# Patient Record
Sex: Female | Born: 1970 | Hispanic: No | Marital: Married | Smoking: Never smoker
Health system: Southern US, Community
[De-identification: ages and names within clinical notes are randomized; demographics above are authoritative.]

## PROBLEM LIST (undated history)

## (undated) DIAGNOSIS — M25531 Pain in right wrist: Secondary | ICD-10-CM

## (undated) DIAGNOSIS — M549 Dorsalgia, unspecified: Secondary | ICD-10-CM

## (undated) DIAGNOSIS — G8929 Other chronic pain: Secondary | ICD-10-CM

## (undated) HISTORY — DX: Pain in right wrist: M25.531

## (undated) HISTORY — DX: Other chronic pain: G89.29

## (undated) HISTORY — DX: Dorsalgia, unspecified: M54.9

---

## 2001-11-20 ENCOUNTER — Emergency Department (HOSPITAL_COMMUNITY): Admission: EM | Admit: 2001-11-20 | Discharge: 2001-11-20 | Payer: Self-pay | Admitting: *Deleted

## 2001-11-20 ENCOUNTER — Encounter: Payer: Self-pay | Admitting: Emergency Medicine

## 2001-11-30 ENCOUNTER — Observation Stay (HOSPITAL_COMMUNITY): Admission: RE | Admit: 2001-11-30 | Discharge: 2001-11-30 | Payer: Self-pay | Admitting: General Surgery

## 2001-11-30 ENCOUNTER — Encounter: Payer: Self-pay | Admitting: General Surgery

## 2001-11-30 ENCOUNTER — Encounter (INDEPENDENT_AMBULATORY_CARE_PROVIDER_SITE_OTHER): Payer: Self-pay

## 2007-05-26 ENCOUNTER — Other Ambulatory Visit: Admission: RE | Admit: 2007-05-26 | Discharge: 2007-05-26 | Payer: Self-pay | Admitting: Gynecology

## 2008-05-30 ENCOUNTER — Other Ambulatory Visit: Admission: RE | Admit: 2008-05-30 | Discharge: 2008-05-30 | Payer: Self-pay | Admitting: Gynecology

## 2011-03-22 NOTE — Op Note (Signed)
[facility]  Patient:    Crystal Guerrero, Crystal Guerrero Visit Number: [id number] MRN: [id number]          Service Type: SUR Location: [id number] Attending Physician:  Henrene Dodge Dictated by:   Anselm Pancoast. Zachery Dakins, M.D. Proc. Date: 11/30/01 Admit Date:  11/30/2001                             Operative Report  PREOPERATIVE DIAGNOSIS:  Chronic cholecystitis with stones.  POSTOPERATIVE DIAGNOSIS:  Chronic cholecystitis with stones.  OPERATION:  Laparoscopic cholecystectomy with cholangiogram.  ANESTHESIA:  General.  SURGEON:  Anselm Pancoast. Zachery Dakins, M.D.  ASSISTANT:  Sharlet Salina T. Hoxworth, M.D.  HISTORY:  Crystal Guerrero is a [place] American who presented to the [facility] Emergency Room with severe epigastric pain. She has had previous little similar episodes and she was evaluated with pain medication and then ultrasound of the gallbladder that showed stones. I saw her in the office this past week and she was not acutely ill at that time, but she desired to proceed on with a laparoscopic cholecystectomy promptly. She has had two pregnancies, the last one was about six months ago. The patient was otherwise in good health. Preoperatively, she was given 3 g of Unasyn as well as PAS stockings.  DESCRIPTION OF PROCEDURE:  Induction of general anesthesia with endotracheal tube, oral tube into the stomach, and then the abdomen was prepped with Betadine surgical solution and draped in a sterile manner. A small incision was made below the umbilicus, the fascia identified and picked up between two Kochers and small opening made and I carefully opened into the peritoneal cavity. A pursestring suture of 0 Vicryl was placed and then the Hasson cannula introduced. The gallbladder had adhesions around it; it was not acutely inflamed. The upper 10 mm trocar was placed under direct vision in the subxiphoid area after anesthetizing the fascia, and two lateral 5 mm  trocars were placed in the appropriate position. I then sort of grasped the gallbladder, retracted it up and outward. The inferior grasper was used to grab the adhesions around the gallbladder and then I kind of carefully took these down with hot scissors. The cystic duct was visualized and there was a stone sort of impacted in the neck of the cystic duct and gallbladder, and we then pushed this back up into the gallbladder and then placed a clip flush with the gallbladder junction with the cystic duct. The cystic artery area was freed up and this was doubly clipped proximally, singly distally, and then a small opening was made into the cystic duct. The [facility] catheter was introduced, held in place with a clip, and x-ray was obtained and showed good prompt filling of the extrahepatic biliary system; kind of a long cystic duct and I then withdrew the catheter, triply clipped the cystic duct kind of going a little more proximal and then divided it.  Next, the cystic artery, I actually placed another clip flush with the gallbladder and then left all three clips on the cystic duct and the cystic artery then you could visualize nicely. The gallbladder from then freed from its bed using the hook electrocautery. I cut the _______ vessels that I think had been previously clipped or reclipped because of their size on the edge of the gallbladder wall. The gallbladder was placed in the Endocatch bag and then withdrawn at the umbilicus. There was good hemostasis of the bed and then  the irrigating fluid that had been used was removed and we reaspirated and inspected good hemostasis, and removed the aspirated fluid. I then placed an extra figure-of-eight suture in the umbilicus fascia and anesthetized this with Marcaine, all total about 20 cc of the Marcaine was used. The lateral 5 mm trocars were withdrawn. The subcutaneous incisions with closed with 4-0 Vicryl and then benzoin and Steri-Strips on the  skin. The patient tolerated the procedure nicely and was extubated and sent to recovery room in satisfactory postoperative condition. Dictated by:   Anselm Pancoast. Zachery Dakins, M.D. Attending Physician:  Henrene Dodge DD:  11/30/01 TD:  11/30/01 Job: [id number]

## 2016-03-12 ENCOUNTER — Encounter (HOSPITAL_COMMUNITY): Payer: Self-pay

## 2016-03-12 ENCOUNTER — Emergency Department (HOSPITAL_COMMUNITY)
Admission: EM | Admit: 2016-03-12 | Discharge: 2016-03-12 | Disposition: A | Discharge status: Home / Self Care | Payer: No Typology Code available for payment source | Attending: Emergency Medicine | Admitting: Emergency Medicine

## 2016-03-12 ENCOUNTER — Emergency Department (HOSPITAL_COMMUNITY): Payer: No Typology Code available for payment source

## 2016-03-12 DIAGNOSIS — M62830 Muscle spasm of back: Secondary | ICD-10-CM | POA: Insufficient documentation

## 2016-03-12 DIAGNOSIS — Y92410 Unspecified street and highway as the place of occurrence of the external cause: Secondary | ICD-10-CM | POA: Insufficient documentation

## 2016-03-12 DIAGNOSIS — S161XXA Strain of muscle, fascia and tendon at neck level, initial encounter: Secondary | ICD-10-CM | POA: Insufficient documentation

## 2016-03-12 DIAGNOSIS — S6991XA Unspecified injury of right wrist, hand and finger(s), initial encounter: Secondary | ICD-10-CM | POA: Insufficient documentation

## 2016-03-12 DIAGNOSIS — S199XXA Unspecified injury of neck, initial encounter: Secondary | ICD-10-CM | POA: Diagnosis present

## 2016-03-12 DIAGNOSIS — S6992XA Unspecified injury of left wrist, hand and finger(s), initial encounter: Secondary | ICD-10-CM | POA: Insufficient documentation

## 2016-03-12 DIAGNOSIS — Y998 Other external cause status: Secondary | ICD-10-CM | POA: Diagnosis not present

## 2016-03-12 DIAGNOSIS — Y9389 Activity, other specified: Secondary | ICD-10-CM | POA: Diagnosis not present

## 2016-03-12 DIAGNOSIS — S8001XA Contusion of right knee, initial encounter: Secondary | ICD-10-CM | POA: Insufficient documentation

## 2016-03-12 DIAGNOSIS — S3992XA Unspecified injury of lower back, initial encounter: Secondary | ICD-10-CM | POA: Diagnosis not present

## 2016-03-12 DIAGNOSIS — S93601A Unspecified sprain of right foot, initial encounter: Secondary | ICD-10-CM

## 2016-03-12 DIAGNOSIS — T07XXXA Unspecified multiple injuries, initial encounter: Secondary | ICD-10-CM

## 2016-03-12 MED ORDER — CYCLOBENZAPRINE HCL 10 MG PO TABS: 10.0000 mg | ORAL_TABLET | Freq: Two times a day (BID) | ORAL | Status: DC | PRN

## 2016-03-12 MED ORDER — NAPROXEN 500 MG PO TABS: 500.0000 mg | ORAL_TABLET | Freq: Two times a day (BID) | ORAL | Status: DC

## 2016-03-12 MED ORDER — IBUPROFEN 400 MG PO TABS
600.0000 mg | ORAL_TABLET | Freq: Once | ORAL | Status: AC
  Administered 2016-03-12: 600 mg via ORAL
  Filled 2016-03-12: qty 1
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
## 2016-03-12 NOTE — ED Notes (Signed)
Pt verbalized understanding of d/c instructions and has no further questions. Pt ambulatory, and NAD
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
## 2016-05-22 NOTE — ED Provider Notes (Signed)
Medical screening examination/treatment/procedure(s) were performed by non-physician practitioner and as supervising physician I was immediately available for consultation/collaboration.   EKG Interpretation None       Jacalyn LefevreJulie Sehaj Kolden, MD 05/22/16 1050
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
## 2018-03-05 ENCOUNTER — Ambulatory Visit: Payer: Self-pay | Admitting: Internal Medicine

## 2018-03-05 ENCOUNTER — Other Ambulatory Visit: Payer: Self-pay

## 2018-03-05 VITALS — BP 125/76 | HR 79 | Temp 98.7°F | Ht 62.0 in | Wt 147.4 lb

## 2018-03-05 DIAGNOSIS — R200 Anesthesia of skin: Secondary | ICD-10-CM

## 2018-03-05 DIAGNOSIS — M546 Pain in thoracic spine: Secondary | ICD-10-CM

## 2018-03-05 DIAGNOSIS — M67471 Ganglion, right ankle and foot: Secondary | ICD-10-CM

## 2018-03-05 DIAGNOSIS — M542 Cervicalgia: Secondary | ICD-10-CM

## 2018-03-05 DIAGNOSIS — G8929 Other chronic pain: Secondary | ICD-10-CM

## 2018-03-05 DIAGNOSIS — G8921 Chronic pain due to trauma: Secondary | ICD-10-CM

## 2018-03-05 DIAGNOSIS — M25531 Pain in right wrist: Secondary | ICD-10-CM | POA: Insufficient documentation

## 2018-03-05 HISTORY — DX: Pain in right wrist: M25.531

## 2018-03-05 MED ORDER — NAPROXEN 500 MG PO TABS: 500.0000 mg | ORAL_TABLET | Freq: Two times a day (BID) | ORAL | 0 refills | Status: DC

## 2018-03-05 NOTE — Patient Instructions (Signed)
It was a pleasure to meet you today!  I think your back pain is due to muscle spasms. I do not see anything concerning for bone or nerve problems today. I recommend starting some stretch exercises and also you can take antiinflammatory medicine either ibuprofen every 6 hours or aleve every 12 hours for 1-2 weeks and see if this helps.  Your foot seems to have a cyst or maybe a bunion. These require surgical treatment for a long term cure. After getting set up with our system we can refer you to [facility] for a podiatrist to handle this.  I suspect carpal tunnel syndrome in the right wrist. We can observe this for now, the concern would be if you notice weakness or clumsiness in the hand.
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
## 2018-03-11 ENCOUNTER — Encounter: Payer: Self-pay | Admitting: Internal Medicine

## 2018-03-11 NOTE — Assessment & Plan Note (Signed)
This back pain currently seems most like trapezius or paraspinal stabilization muscle spasm exacerbated during periods of prolonged use.  Her strength and range of motion is preserved. Plan: Recommended 1 to 2-week course of oral NSAIDs Instructions provided for upper back range of motion exercises focusing on trapezius range of motion and scapular stabilization

## 2018-03-11 NOTE — Assessment & Plan Note (Signed)
She has a right great toe likely bunion.  It is relatively soft and superiorly located so a communicating cyst is not impossible.  In either case definitive management would be surgical revision.  She has no major risk factors for complication in the short-term.  Plan: After she is arranged [facility] care network orange card with Rudell Cobb we can refer to [facility] for treatment.

## 2018-03-11 NOTE — Assessment & Plan Note (Signed)
The symptoms are highly consistent with carpal tunnel syndrome. The severity is mild.  I discussed with her the concerning features would be progression of numbness or trouble with grip strength. I recommend she can just observe this or if needed obtain a wrist brace for wearing at night.

## 2018-03-11 NOTE — Progress Notes (Signed)
Internal Medicine Clinic Attending  Case discussed with Dr. Rice  at the time of the visit.  We reviewed the resident's history and exam and pertinent patient test results.  I agree with the assessment, diagnosis, and plan of care documented in the resident's note.  Alexander N Raines, MD   

## 2018-04-01 ENCOUNTER — Ambulatory Visit: Payer: Self-pay

## 2018-05-22 ENCOUNTER — Encounter: Payer: Self-pay | Admitting: Internal Medicine

## 2018-06-10 ENCOUNTER — Encounter (INDEPENDENT_AMBULATORY_CARE_PROVIDER_SITE_OTHER): Payer: Self-pay

## 2018-06-10 ENCOUNTER — Ambulatory Visit (INDEPENDENT_AMBULATORY_CARE_PROVIDER_SITE_OTHER): Payer: Self-pay | Admitting: Internal Medicine

## 2018-06-10 VITALS — BP 129/77 | HR 70 | Temp 98.0°F | Wt 140.7 lb

## 2018-06-10 DIAGNOSIS — M21611 Bunion of right foot: Secondary | ICD-10-CM

## 2018-06-10 DIAGNOSIS — M21619 Bunion of unspecified foot: Secondary | ICD-10-CM

## 2018-06-10 DIAGNOSIS — R42 Dizziness and giddiness: Secondary | ICD-10-CM

## 2018-06-10 NOTE — Progress Notes (Signed)
   CC: Dizziness and R foot bunion   HPI:  [name] is a 47 y.o. female with PMH listed below who presents to clinic for evaluation of dizziness and right foot bunion.  Dizziness: Patient presents with a 22-day history of constant dizziness that she has difficulty describing. It does not feel as loss of balance, room spinning, or near syncope. She does report feeling like she is not walking in a straight line even though she is.  She denies recent illness, fever, chills auditory symptoms or changes in vision, headache, and weakness.  No similar symptoms in the past.  She does not take any medications, vitamins, or herbal supplements.  On exam she has catch-up saccades, but otherwise no other neurological deficits.  Unclear etiology at this time, possible acute vestibular neuritis though no recent illness.  We will continue to monitor for now. Advised to follow up in 4 weeks or sooner if needed. Would likely benefit from further imaging with brain MRI if dizziness persists to evaluate for MS and other intracranial abnormalities. Very low suspicion for stroke at this time in a patient without risk factors.   Bunion: Patient reports complaining of pain in the right foot from bunion.  States she finds it difficult to ambulate at times due to uncomfortable shoes and pain. R foot bunion observed on exam without wounds or signs of infection. - Referral to podiatry  Past Medical History:  Diagnosis Date  . Chronic upper back pain    Review of Systems:   Review of Systems  Constitutional: Negative for fever.  HENT: Negative for ear pain, hearing loss, sinus pain and tinnitus.   Eyes: Negative for blurred vision and double vision.  Musculoskeletal: Positive for joint pain.  Neurological: Positive for dizziness. Negative for tingling, focal weakness, loss of consciousness and headaches.    Physical Exam:  Vitals:   06/10/18 1515  BP: 129/77  Pulse: 70  Temp: 98 F (36.7 C)  TempSrc:  Oral  SpO2: 100%  Weight: 140 lb 11.2 oz (63.8 kg)   General: Young female, well-nourished, well-developed, no acute distress CV: RRR, nl S1/S2, no mrg  Pulm:CTAB, no increase of breathing on room air Neuro: A&Ox3, EOMI, catch up saccades noted, no nystagmus, negative Romberg, no gait abnormalities, sensation and strength intact in all extremities Ext: R foot bunion, no wounds or signs of infection noted   Assessment & Plan:   See Encounters Tab for problem based charting.  Patient seen with Dr. Oswaldo DoneVincent

## 2018-06-10 NOTE — Patient Instructions (Signed)
Crystal CaddySra. Lopezgarcia,   El podiatra la va a llamar para sacar una cita. si no la llaman dejenos saber.   Por favor, dejeme saber si sus mareos no se mejoran en las proximas 2 semanas.   Llamenos en si tiene alguna pregunta o duda.   - Dra. Evelene CroonSantos

## 2018-06-11 ENCOUNTER — Encounter: Payer: Self-pay | Admitting: Internal Medicine

## 2018-06-11 NOTE — Assessment & Plan Note (Signed)
Dizziness: Patient presents with a 22-day history of constant dizziness that she has difficulty describing. It does not feel as loss of balance, room spinning, or near syncope. She does report feeling like she is not walking in a straight line even though she is.  She denies recent illness, fever, chills auditory symptoms or changes in vision, headache, and weakness.  No similar symptoms in the past.  She does not take any medications, vitamins, or herbal supplements.  On exam she has catch-up saccades, but otherwise no other neurological deficits.  Unclear etiology at this time, possible acute vestibular neuritis though no recent illness.  We will continue to monitor for now. Advised to follow up in 4 weeks or sooner if needed.  Would likely benefit from further imaging with brain MRI if dizziness persists to evaluate for MS and other intracranial abnormalities. Very low suspicion for stroke at this time in a patient without risk factors.

## 2018-06-11 NOTE — Assessment & Plan Note (Signed)
Bunion: Patient reports complaining of pain in the right foot from bunion. States she finds it difficult to ambulate at times due to uncomfortable shoes and pain. R foot bunion observed on exam without wounds or signs of infection. - Referral to podiatry

## 2018-06-12 NOTE — Progress Notes (Signed)
Internal Medicine Clinic Attending  I saw and evaluated the patient.  I personally confirmed the key portions of the history and exam documented by Dr. Santos-Sanchez and I reviewed pertinent patient test results.  The assessment, diagnosis, and plan were formulated together and I agree with the documentation in the resident's note. 

## 2018-07-07 ENCOUNTER — Ambulatory Visit: Payer: Self-pay

## 2018-08-10 ENCOUNTER — Ambulatory Visit: Payer: Self-pay | Attending: Family Medicine | Admitting: Podiatry

## 2018-08-10 DIAGNOSIS — M21619 Bunion of unspecified foot: Secondary | ICD-10-CM

## 2018-08-10 NOTE — Patient Instructions (Signed)

## 2018-08-11 NOTE — Progress Notes (Signed)
Subjective:   Patient ID: Crystal Guerrero, female   DOB: 47 y.o.   MRN: [id number]   HPI 47 year old female presents the office today for concerns of a painful bunion to the right foot is been ongoing for about 1 year.  It hurts with closed in shoes.  She has no pain today she is wearing open toed shoe.  When I first asked that they had any other treatment they state that she has not had no treatment otherwise.  She denies any swelling or redness.   Review of Systems  All other systems reviewed and are negative.  Past Medical History:  Diagnosis Date  . Chronic upper back pain     No past surgical history on file.   Current Outpatient Medications:  .  naproxen (NAPROSYN) 500 MG tablet, Take 1 tablet (500 mg total) by mouth 2 (two) times daily., Disp: 20 tablet, Rfl: 0  No Known Allergies       Objective:  Physical Exam  General: AAO x3, NAD  Dermatological: Skin is warm, dry and supple bilateral. Nails x 10 are well manicured; remaining integument appears unremarkable at this time. There are no open sores, no preulcerative lesions, no rash or signs of infection present.  Vascular: Dorsalis Pedis artery and Posterior Tibial artery pedal pulses are 2/4 bilateral with immedate capillary fill time.  There is no pain with calf compression, swelling, warmth, erythema.   Neruologic: Grossly intact via light touch bilateral. Vibratory intact via tuning fork bilateral. Protective threshold with Semmes Wienstein monofilament intact to all pedal sites bilateral.  Musculoskeletal: Mild to moderate bunion deformities present the right foot.  There is no significant tenderness palpation of the bunion site today but she states it is painful with wearing closed in shoes and she has difficulty wearing shoes.  There is no pain or crepitation with MPJ range of motion there is no first ray hypermobility present.  Muscular strength 5/5 in all groups tested bilateral.  Gait: Unassisted, Nonantalgic.      Assessment:   47 year old with right foot bunion deformity     Plan:  -Treatment options discussed including all alternatives, risks, and complications -Etiology of symptoms were discussed -At this time she states that she is in no significant treatment.  We discussed conservative treatment including shoe modifications, offloading padding.  I will have her come by my office tomorrow just to pick up some offloading pads for the bunion.  Also discussed shoe modifications and wearing a shoe with a softer material also adding on light arch support to be helpful.  If symptoms continue we discussed surgical intervention.  She wanted to go and proceed with surgery but she said no significant treatment.  After I stated this she states that she had tried some pads previously which were not helpful.  I still to continue conservative treatment for now but if symptoms continue we can always do surgery in the future if needed.  Vivi Barrack DPM

## 2018-08-14 ENCOUNTER — Encounter: Payer: Self-pay | Admitting: Internal Medicine

## 2018-08-27 ENCOUNTER — Encounter: Payer: Self-pay | Admitting: Podiatry

## 2018-08-27 ENCOUNTER — Ambulatory Visit (INDEPENDENT_AMBULATORY_CARE_PROVIDER_SITE_OTHER): Payer: No Typology Code available for payment source | Admitting: Podiatry

## 2018-08-27 ENCOUNTER — Ambulatory Visit: Payer: Self-pay

## 2018-08-27 DIAGNOSIS — M2011 Hallux valgus (acquired), right foot: Secondary | ICD-10-CM

## 2018-08-27 NOTE — Progress Notes (Signed)
   Subjective:    Patient ID: Everlene Other, female    DOB: 20-Jun-1971, 47 y.o.   MRN: [id number]  HPI 47 year old female presents the office today with her daughter for concerns of continued pain of the right foot bunion.  [facility]  She presents today for continued pain on the bunion site.  Today she states that it hurts all the time no matter what she she is today.  She also feels that she is tried several over-the-counter pads.  She came to the office to pick up new offloading pads and she states that she is also tried these as well and she said no significant improvement.  She wants to discuss surgical intervention at this point.  Her story has been inconsistent.  When I first met her I was told she had no treatment and later on after I did not want to discuss surgery because she had no conservative treatment so that she had treatment.  After discussion today with her daughter she is actually seen another physician as well for this.  This is been ongoing issue for her.   Review of Systems  All other systems reviewed and are negative.  Past Medical History:  Diagnosis Date  . Chronic upper back pain     History reviewed. No pertinent surgical history.   Current Outpatient Medications:  .  naproxen (NAPROSYN) 500 MG tablet, Take 1 tablet (500 mg total) by mouth 2 (two) times daily., Disp: 20 tablet, Rfl: 0  No Known Allergies       Objective:   Physical Exam General: AAO x3, NAD  Dermatological: Skin is warm, dry and supple bilateral. Nails x 10 are well manicured; remaining integument appears unremarkable at this time. There are no open sores, no preulcerative lesions, no rash or signs of infection present.  Vascular: Dorsalis Pedis artery and Posterior Tibial artery pedal pulses are 2/4 bilateral with immedate capillary fill time. Pedal hair growth present. No varicosities and no lower extremity edema present bilateral. There is no  pain with calf compression, swelling, warmth, erythema.   Neruologic: Grossly intact via light touch bilateral. Protective threshold with Semmes Wienstein monofilament intact to all pedal sites bilateral.   Musculoskeletal: Moderate bunion deformities present.  Is no tenderness palpation of the right foot from the wound site.  There is no pain or crepitation with MPJ range of motion.  Muscular strength 5/5 in all groups tested bilateral.  Gait: Unassisted, Nonantalgic.     Assessment & Plan:  47 year old female symptomatic right foot bunion -Treatment options discussed including all alternatives, risks, and complications -Etiology of symptoms were discussed -X-rays were obtained and reviewed with the patient.  Moderate pain is present.  There is no evidence of acute fracture. -We discussed both conservative as well as surgical treatment options.  At this time she is attempted numerous conservative options were any significant treatment.  Discussed surgical intervention but also discussed that this is not a guarantee resolution of symptoms there is always a chance of recurrence of crying.  They would consider this.  We will plan on doing this after the first year.  Also this was the end of the year at the beginning of January and we will further discuss surgery.  They are in agreement.  No further questions or concerns they are happy with this timeline.  Trula Slade DPM

## 2018-08-30 DIAGNOSIS — M2011 Hallux valgus (acquired), right foot: Secondary | ICD-10-CM | POA: Insufficient documentation

## 2018-10-19 ENCOUNTER — Ambulatory Visit: Payer: Self-pay | Admitting: Podiatry

## 2018-10-20 ENCOUNTER — Ambulatory Visit (INDEPENDENT_AMBULATORY_CARE_PROVIDER_SITE_OTHER): Payer: Self-pay | Admitting: Podiatry

## 2018-10-20 DIAGNOSIS — M2011 Hallux valgus (acquired), right foot: Secondary | ICD-10-CM

## 2018-10-20 NOTE — Patient Instructions (Signed)

## 2018-10-29 NOTE — Progress Notes (Signed)
Subjective: 47 year old female presents the office today for a surgical consultation for continued pain on the right bunion deformity.  Given she is attempted numerous conservative treatments including, but not limited to shoe modifications, offloading padding she wished to go to proceed with surgery as she is having pain on a daily basis.  She presents today with her daughter who is translating. Denies any systemic complaints such as fevers, chills, nausea, vomiting. No acute changes since last appointment, and no other complaints at this time.   Objective: AAO x3, NAD DP/PT pulses palpable bilaterally, CRT less than 3 seconds Moderate bunion deformities present on the right foot there is tenderness along the bunion site.  There is slight erythema from her rubs inside shoes but there is no skin breakdown or increase in warmth.  There is no pain or crepitation with first MPJ range of motion.  No first ray hypermobility is present.  No other areas of tenderness.  No open lesions or pre-ulcerative lesions.  No pain with calf compression, swelling, warmth, erythema  Assessment: Bunion right foot  Plan: -All treatment options discussed with the patient including all alternatives, risks, complications.  We again discussed the surgery as well as the postoperative course.  Reviewed the x-rays again today.  At this time she wished to proceed with surgery and her daughter is in agreement with this as well.  Discussed possible bunionectomy with screw fixation. -The incision placement as well as the postoperative course was discussed with the patient. I discussed risks of the surgery which include, but not limited to, infection, bleeding, pain, swelling, need for further surgery, delayed or nonhealing, painful or ugly scar, numbness or sensation changes, over/under correction, recurrence, transfer lesions, further deformity, hardware failure, DVT/PE, loss of toe/foot. Patient understands these risks and wishes  to proceed with surgery. The surgical consent was reviewed with the patient all 3 pages were signed. No promises or guarantees were given to the outcome of the procedure. All questions were answered to the best of my ability. Before the surgery the patient was encouraged to call the office if there is any further questions. [facility].  Vivi BarrackMatthew R Wagoner DPM

## 2018-11-11 ENCOUNTER — Ambulatory Visit: Payer: Self-pay

## 2018-11-18 ENCOUNTER — Ambulatory Visit: Payer: Self-pay

## 2018-12-03 ENCOUNTER — Ambulatory Visit: Payer: Self-pay | Admitting: Internal Medicine

## 2018-12-03 ENCOUNTER — Encounter: Payer: Self-pay | Admitting: Internal Medicine

## 2018-12-03 DIAGNOSIS — M21611 Bunion of right foot: Secondary | ICD-10-CM

## 2018-12-03 DIAGNOSIS — M546 Pain in thoracic spine: Secondary | ICD-10-CM

## 2018-12-03 DIAGNOSIS — G8929 Other chronic pain: Secondary | ICD-10-CM

## 2018-12-03 DIAGNOSIS — M2011 Hallux valgus (acquired), right foot: Secondary | ICD-10-CM

## 2018-12-03 DIAGNOSIS — Z791 Long term (current) use of non-steroidal anti-inflammatories (NSAID): Secondary | ICD-10-CM

## 2018-12-03 MED ORDER — NAPROXEN 500 MG PO TABS: 500.0000 mg | ORAL_TABLET | Freq: Two times a day (BID) | ORAL | 0 refills | Status: DC

## 2018-12-03 NOTE — Progress Notes (Signed)
Case discussed with Dr. Harbrecht at the time of the visit.  We reviewed the resident's history and exam and pertinent patient test results.  I agree with the assessment, diagnosis and plan of care documented in the resident's note. 

## 2018-12-03 NOTE — Progress Notes (Signed)
   CC: right foot pain  HPI:[name] is a 48 y.o. female who presents for evaluation of right foot pain. Please see individual problem based A/P for details.  PHQ-9: Based on the patients    Office Visit from 12/03/2018 in [facility]  PHQ-9 Total Score  0     score we have decided to monitor.  Past Medical History:  Diagnosis Date  . Chronic upper back pain    Review of Systems: ROS negative except as per HPI.  Physical Exam: Vitals:   12/03/18 1552  BP: 122/73  Pulse: 71  Weight: 146 lb 8 oz (66.5 kg)   General: A/O x4, in no acute distress, afebrile, nondiaphoretic MSK: BLE are nonedematous, there is a tender raised hypermobile 1.25x1.25 mass overlying the 1st metatarsopharyngeal joint medially.  Assessment & Plan:   See Encounters Tab for problem based charting.  Patient discussed with Dr. Josem Kaufmann

## 2018-12-03 NOTE — Patient Instructions (Signed)
FOLLOW-UP INSTRUCTIONS When: 4-6 wks For: Routine visit What to bring: All of your medications  I have refilled the Aleve today for your foot pain. Please be sure to follow up with the foot doctor once you are able.  Please continue to use the foot orthotics/braces for comfort as needed. I agree that surgery is the most likely intervention to benefit you.  Thank you for your visit to the [facility] today. If you have any questions or concerns please call us at [phone].

## 2018-12-03 NOTE — Assessment & Plan Note (Signed)
Refilled Aleve PRN

## 2018-12-03 NOTE — Assessment & Plan Note (Signed)
  Right foot pain for Hallux valgus with 2/2 bunion of the great toe: Patient presents today for pain and tenderness of the right foot. This is persistent despite treatment with orthotics, shoe modifications, offloading padding, and NSAIDS. She has seen podiatry who are recommending surgery. She denied foot pain, numbness or tingling of the foot, ankle-knee or hip pain.   Plan: Refilled Aleve 220mg  PO PRN for pain Follow-up with Podiatry Continue shoe modifications and orthotics

## 2018-12-29 ENCOUNTER — Encounter: Payer: Self-pay | Admitting: Internal Medicine

## 2019-01-22 ENCOUNTER — Encounter: Payer: Self-pay | Admitting: Internal Medicine

## 2019-03-12 ENCOUNTER — Other Ambulatory Visit: Payer: Self-pay

## 2019-03-12 ENCOUNTER — Encounter: Payer: Self-pay | Admitting: Internal Medicine

## 2019-03-14 ENCOUNTER — Telehealth: Payer: Self-pay | Admitting: Internal Medicine

## 2019-03-14 NOTE — Telephone Encounter (Signed)
Called patient several times for telehealth appointment. No answer. Left a voice message to return call.  

## 2019-06-21 ENCOUNTER — Encounter: Payer: Self-pay | Admitting: Internal Medicine

## 2019-06-28 ENCOUNTER — Ambulatory Visit: Payer: Self-pay | Admitting: Internal Medicine

## 2019-06-28 ENCOUNTER — Encounter: Payer: Self-pay | Admitting: Internal Medicine

## 2019-06-28 ENCOUNTER — Other Ambulatory Visit: Payer: Self-pay

## 2019-06-28 ENCOUNTER — Other Ambulatory Visit (HOSPITAL_COMMUNITY)
Admission: RE | Admit: 2019-06-28 | Discharge: 2019-06-28 | Disposition: A | Discharge status: Home / Self Care | Payer: Self-pay | Source: Ambulatory Visit | Attending: Internal Medicine | Admitting: Internal Medicine

## 2019-06-28 VITALS — BP 123/81 | HR 85 | Temp 98.7°F | Ht 62.0 in | Wt 151.0 lb

## 2019-06-28 DIAGNOSIS — Z124 Encounter for screening for malignant neoplasm of cervix: Secondary | ICD-10-CM | POA: Insufficient documentation

## 2019-06-28 DIAGNOSIS — Z0000 Encounter for general adult medical examination without abnormal findings: Secondary | ICD-10-CM

## 2019-06-28 NOTE — Patient Instructions (Addendum)
Waymon Budge le hicimos un papanicolao y examenes de [place]. Estos tardaran aproxidamente 1 semana. Cuando me lleguen los [employer] llamare para informarle.   Haga una cita de seguimiento conmigo en 6 meses o antes si necesita verme antes.   - Dra. Frederico Hamman

## 2019-06-29 ENCOUNTER — Encounter: Payer: Self-pay | Admitting: Internal Medicine

## 2019-06-29 LAB — LIPID PANEL
Chol/HDL Ratio: 5.3 ratio — ABNORMAL HIGH (ref 0.0–4.4)
Cholesterol, Total: 180 mg/dL (ref 100–199)
HDL: 34 mg/dL — ABNORMAL LOW (ref >39)
LDL Calculated: 113 mg/dL — ABNORMAL HIGH (ref 0–99)
Triglycerides: 165 mg/dL — ABNORMAL HIGH (ref 0–149)
VLDL Cholesterol Cal: 33 mg/dL (ref 5–40)

## 2019-06-29 LAB — CMP14 + ANION GAP
ALT: 15 IU/L (ref 0–32)
AST: 14 IU/L (ref 0–40)
Albumin/Globulin Ratio: 1.6 (ref 1.2–2.2)
Albumin: 4.5 g/dL (ref 3.8–4.8)
Alkaline Phosphatase: 54 IU/L (ref 39–117)
Anion Gap: 13 mmol/L (ref 10.0–18.0)
BUN/Creatinine Ratio: 19 (ref 9–23)
BUN: 11 mg/dL (ref 6–24)
Bilirubin Total: 0.6 mg/dL (ref 0.0–1.2)
CO2: 20 mmol/L (ref 20–29)
Calcium: 9.3 mg/dL (ref 8.7–10.2)
Chloride: 105 mmol/L (ref 96–106)
Creatinine, Ser: 0.59 mg/dL (ref 0.57–1.00)
GFR calc Af Amer: 126 mL/min/{1.73_m2} (ref >59)
GFR calc non Af Amer: 109 mL/min/{1.73_m2} (ref >59)
Globulin, Total: 2.8 g/dL (ref 1.5–4.5)
Glucose: 95 mg/dL (ref 65–99)
Potassium: 4.2 mmol/L (ref 3.5–5.2)
Sodium: 138 mmol/L (ref 134–144)
Total Protein: 7.3 g/dL (ref 6.0–8.5)

## 2019-06-29 LAB — CBC WITH DIFFERENTIAL/PLATELET
Basophils Absolute: 0 10*3/uL (ref 0.0–0.2)
Basos: 0 %
EOS (ABSOLUTE): 0 10*3/uL (ref 0.0–0.4)
Eos: 0 %
Hematocrit: 39.4 % (ref 34.0–46.6)
Hemoglobin: 13.4 g/dL (ref 11.1–15.9)
Immature Grans (Abs): 0.1 10*3/uL (ref 0.0–0.1)
Immature Granulocytes: 1 %
Lymphocytes Absolute: 2 10*3/uL (ref 0.7–3.1)
Lymphs: 27 %
MCH: 30.5 pg (ref 26.6–33.0)
MCHC: 34 g/dL (ref 31.5–35.7)
MCV: 90 fL (ref 79–97)
Monocytes Absolute: 0.5 10*3/uL (ref 0.1–0.9)
Monocytes: 7 %
Neutrophils Absolute: 4.9 10*3/uL (ref 1.4–7.0)
Neutrophils: 65 %
Platelets: 263 10*3/uL (ref 150–450)
RBC: 4.4 x10E6/uL (ref 3.77–5.28)
RDW: 12.7 % (ref 11.7–15.4)
WBC: 7.5 10*3/uL (ref 3.4–10.8)

## 2019-06-29 LAB — HIV ANTIBODY (ROUTINE TESTING W REFLEX): HIV Screen 4th Generation wRfx: NONREACTIVE

## 2019-06-29 LAB — RPR: RPR Ser Ql: NONREACTIVE

## 2019-06-29 NOTE — Progress Notes (Signed)
   CC: Encounter for PAP smear   HPI:  [name] is a 48 y.o. year-old female with PMH listed below who presents to clinic for encounter for pap smear. Please see problem based assessment and plan for further details.   Past Medical History:  Diagnosis Date  . Chronic upper back pain   . Right wrist pain 03/05/2018    Review of Systems:   Review of Systems  Constitutional: Negative for chills and fever.  Genitourinary: Negative for dysuria, frequency, hematuria and urgency.       No discharge or vaginal pain     Physical Exam:  Vitals:   06/28/19 1611  BP: 123/81  Pulse: 85  Temp: 98.7 F (37.1 C)  TempSrc: Oral  SpO2: 100%  Weight: 151 lb (68.5 kg)  Height: 5\' 2"  (1.575 m)    General: well-appearing female in no acute distress  GU: external exam unremarkable, normal hair distribution, no lesions or rashes noted. Vaginal wall well-rugated. No discharge. Cervix is normal appearing, mild bleeding with pap smear swab but otherwise no abnormalities    Assessment & Plan:   See Encounters Tab for problem based charting.  Patient discussed with Dr. Daryll Drown

## 2019-06-29 NOTE — Assessment & Plan Note (Signed)
Patient presents for Pap smear. States her last one was about 3 years ago and was normal. She is unsure if she had any HPV testing. States she has had an abnormal Pap in the past requiring cold knife cone biopsy. We do not have records of this. She does not have a history of STIs. She is in a monogamous relationship and not concern for any STI exposures, but does ask for STI testing today.   Pap smear performed. Sent Cvag for GC/chlamydia, trichomonas, BV, and candida. Also ordered HIV and RPR. Will call patient with results.

## 2019-06-29 NOTE — Assessment & Plan Note (Signed)
Patient requesting "basic labs" today. On review of chart, she does not have any blood work in our system. Ordered CBC, CMP, and lipid profile. Will call patient with results.

## 2019-06-30 LAB — CERVICOVAGINAL ANCILLARY ONLY
Bacterial vaginitis: POSITIVE — AB
Candida vaginitis: NEGATIVE
Chlamydia: NEGATIVE
Neisseria Gonorrhea: NEGATIVE
Trichomonas: NEGATIVE

## 2019-06-30 LAB — CYTOLOGY - PAP
Diagnosis: NEGATIVE
HPV: NOT DETECTED

## 2019-06-30 NOTE — Progress Notes (Signed)
Internal Medicine Clinic Attending  Case discussed with Dr. Santos-Sanchez at the time of the visit.  We reviewed the resident's history and exam and pertinent patient test results.  I agree with the assessment, diagnosis, and plan of care documented in the resident's note.    

## 2019-07-07 ENCOUNTER — Telehealth: Payer: Self-pay | Admitting: Internal Medicine

## 2019-07-07 NOTE — Telephone Encounter (Signed)
Called patient and discussed results. Thanks 

## 2019-07-07 NOTE — Telephone Encounter (Signed)
Pt is rtn 2 missed calls about her Test results.  Please call patient back.

## 2019-07-13 ENCOUNTER — Ambulatory Visit: Payer: Self-pay | Admitting: Family Medicine

## 2019-07-19 ENCOUNTER — Encounter: Payer: Self-pay | Admitting: Internal Medicine

## 2019-07-19 ENCOUNTER — Ambulatory Visit: Payer: Self-pay

## 2019-07-20 ENCOUNTER — Telehealth: Payer: Self-pay | Admitting: Nurse Practitioner

## 2019-07-21 NOTE — Telephone Encounter (Signed)
Opened in error

## 2019-08-10 ENCOUNTER — Encounter: Payer: Self-pay | Admitting: Student

## 2019-12-02 ENCOUNTER — Encounter: Payer: Self-pay | Admitting: Internal Medicine

## 2019-12-02 ENCOUNTER — Ambulatory Visit: Payer: Self-pay | Admitting: Internal Medicine

## 2019-12-02 VITALS — Temp 98.1°F | Ht 62.0 in | Wt 152.0 lb

## 2019-12-02 DIAGNOSIS — H6121 Impacted cerumen, right ear: Secondary | ICD-10-CM

## 2019-12-02 DIAGNOSIS — R42 Dizziness and giddiness: Secondary | ICD-10-CM

## 2019-12-02 DIAGNOSIS — R519 Headache, unspecified: Secondary | ICD-10-CM

## 2019-12-02 MED ORDER — MECLIZINE HCL 50 MG PO TABS: 50.0000 mg | ORAL_TABLET | Freq: Three times a day (TID) | ORAL | 0 refills | Status: DC | PRN

## 2019-12-02 NOTE — Assessment & Plan Note (Addendum)
Seven days of intermittent dizziness. She states she has also had some difficulty sleeping at night recently. The dizziness can last for most of the day but then she'll have days where it doesn't occur at all. Not associated with movement. She had pain yesterday directly above her ear with palpation, not near the temporal area. She denies ear pain, ringing in her ears, changes in hearing, changes in vision, nausea, headaches, focal or diffuse weakness, numbness in the extremities, recent illness, recent travel or sick contacts. She has had no changes in medications. She has not fallen. She has no changes in appetite.  She had one episode of dizziness two years ago which self resolved and has not had any episodes since that time.  Orthostatics within normal limits. Dix-Hallpike negative. HINTS test normal. She does have left gaze nystagmus. Exact etiology unclear but likely 2/2 to impacted cerumen in right ear. After washout she did improvement in hearing. She is low risk for stroke and neurological exam normal, no signs of posterior stroke, BPPV and meniere's unlikely. Possible vestibular neuritis but no recent illness. Differential could include MS and discussed that if symptoms do not resolve within the next 7 days or occur again to return to clinic for imaging.    - meclizine tid prn for 7 days - washout of right ear with removal of large amount of wax - f/u in 7 days if symptoms occur again

## 2019-12-02 NOTE — Patient Instructions (Signed)
Thank you for allowing [place] to provide your care today. Today we discussed your dizziness.     I have ordered the following labs for you:  CBC and BMP    I will call if any are abnormal.    Today we made the following changes to your medications:   Please START taking  Meclizine 50 mg tablet - take one tablet every 8 hours as needed for dizziness.   If symptoms do not resolve in 7 days, please return to the clinic for imaging.   Please call the internal medicine center clinic if you have any questions or concerns, we may be able to help and keep you from a long and expensive emergency room wait. Our clinic and after hours phone number is [phone], the best time to call is Monday through Friday 9 am to 4 pm but there is always someone available 24/7 if you have an emergency. If you need medication refills please notify your pharmacy one week in advance and they will send [place] a request.

## 2019-12-02 NOTE — Progress Notes (Signed)
   CC: dizziness  HPI:  Crystal Guerrero is a 49 y.o. with PMH as below.   Please see A&P for assessment of the patient's acute and chronic medical conditions.   Seven days of intermittent dizziness. She states she has also had some difficulty sleeping at night recently. The dizziness can last for most of the day but then she'll have days where it doesn't occur at all. Not associated with movement. She had pain yesterday directly above her ear with palpation, not near the temporal area. She denies ear pain, ringing in her ears, changes in hearing, changes in vision, nausea, headaches, focal or diffuse weakness, numbness in the extremities, recent illness, recent travel or sick contacts. She has had no changes in medications. She has not fallen. She has no changes in appetite.  She had one episode of dizziness two years ago which self resolved and has not had any episodes since that time.   Past Medical History:  Diagnosis Date  . Chronic upper back pain   . Right wrist pain 03/05/2018   Review of Systems:   10 point ROS negative except as noted in HPI  Physical Exam: Constitution: NAD, appears stated age HENT: left TM intact and clear; cerumen right ear, after washout TM membrane intact, minimal irritation and erythema; currently NTTP above right ear, NTTP temporals and jaw bilaterally  Eyes: eom intact, PERRLA, left gaze nystagmus, HINTs test wnl MSK: strength 5/5 UE and LE bilaterally, sensation intact  Neuro: normal affect, a&ox3, CN II-XII intact, no dysmetria Skin: c/d/i    Vitals:   12/02/19 1518  Temp: 98.1 F (36.7 C)  TempSrc: Oral  SpO2: 100%  Weight: 152 lb (68.9 kg)  Height: 5\' 2"  (1.575 m)    Assessment & Plan:   See Encounters Tab for problem based charting.  Patient discussed with Dr. 

## 2019-12-03 ENCOUNTER — Other Ambulatory Visit: Payer: Self-pay | Admitting: *Deleted

## 2019-12-03 LAB — CBC
Hematocrit: 37 % (ref 34.0–46.6)
Hemoglobin: 12.4 g/dL (ref 11.1–15.9)
MCH: 29.5 pg (ref 26.6–33.0)
MCHC: 33.5 g/dL (ref 31.5–35.7)
MCV: 88 fL (ref 79–97)
Platelets: 269 10*3/uL (ref 150–450)
RBC: 4.21 x10E6/uL (ref 3.77–5.28)
RDW: 13.7 % (ref 11.7–15.4)
WBC: 14.2 10*3/uL — ABNORMAL HIGH (ref 3.4–10.8)

## 2019-12-03 LAB — BMP8+ANION GAP
Anion Gap: 14 mmol/L (ref 10.0–18.0)
BUN/Creatinine Ratio: 15 (ref 9–23)
BUN: 11 mg/dL (ref 6–24)
CO2: 18 mmol/L — ABNORMAL LOW (ref 20–29)
Calcium: 9 mg/dL (ref 8.7–10.2)
Chloride: 107 mmol/L — ABNORMAL HIGH (ref 96–106)
Creatinine, Ser: 0.71 mg/dL (ref 0.57–1.00)
GFR calc Af Amer: 117 mL/min/{1.73_m2} (ref >59)
GFR calc non Af Amer: 102 mL/min/{1.73_m2} (ref >59)
Glucose: 81 mg/dL (ref 65–99)
Potassium: 4.5 mmol/L (ref 3.5–5.2)
Sodium: 139 mmol/L (ref 134–144)

## 2019-12-03 MED ORDER — MECLIZINE HCL 25 MG PO TABS: 50.0000 mg | ORAL_TABLET | Freq: Three times a day (TID) | ORAL | 0 refills | Status: AC | PRN

## 2019-12-03 NOTE — Progress Notes (Signed)
Internal Medicine Clinic Attending  Case discussed with Dr. Seawell at the time of the visit.  We reviewed the resident's history and exam and pertinent patient test results.  I agree with the assessment, diagnosis, and plan of care documented in the resident's note.    

## 2019-12-03 NOTE — Telephone Encounter (Signed)
I have sent this in

## 2019-12-03 NOTE — Telephone Encounter (Signed)
Fax from [facility] - they do not have Meclizine 50 mg in stock;can u send new rx for 25 mg 2 tabs TID . Thanks

## 2019-12-27 ENCOUNTER — Ambulatory Visit: Payer: Self-pay

## 2020-02-02 ENCOUNTER — Ambulatory Visit: Payer: Self-pay | Admitting: Internal Medicine

## 2020-02-02 ENCOUNTER — Other Ambulatory Visit: Payer: Self-pay

## 2020-02-02 DIAGNOSIS — R21 Rash and other nonspecific skin eruption: Secondary | ICD-10-CM

## 2020-02-03 ENCOUNTER — Encounter: Payer: Self-pay | Admitting: Internal Medicine

## 2020-02-03 NOTE — Progress Notes (Signed)
Ms. Crystal Guerrero was not able to come for her appointment due to inability to find care for her granddaughter. I called to offer telehealth. However, she reports she would like to be seen for a rash in her back and chest and would not like to be charged for a telephone visit since she would still have to be seen in person for further evaluation. She has been scheduled with me on 5/17. I advised her to call if rash worsens or if she develops any new symptoms.   Burna Cash, MD  Internal Medicine PGY-3  P [phone]

## 2020-02-07 NOTE — Progress Notes (Signed)
Internal Medicine Clinic Attending  Case discussed with Dr. Lovenia Kim at the time of the visit.  I agree with rescheduling of the appointment to an in person visit

## 2020-02-28 ENCOUNTER — Encounter: Payer: Self-pay | Admitting: *Deleted

## 2020-03-20 ENCOUNTER — Encounter: Payer: Self-pay | Admitting: Internal Medicine

## 2020-05-25 ENCOUNTER — Encounter: Payer: Self-pay | Admitting: Student

## 2020-05-25 ENCOUNTER — Ambulatory Visit: Payer: Self-pay | Admitting: Student

## 2020-05-25 VITALS — BP 134/73 | HR 77 | Temp 98.3°F | Ht 62.0 in | Wt 147.0 lb

## 2020-05-25 DIAGNOSIS — Z1159 Encounter for screening for other viral diseases: Secondary | ICD-10-CM

## 2020-05-25 DIAGNOSIS — Z139 Encounter for screening, unspecified: Secondary | ICD-10-CM

## 2020-05-25 DIAGNOSIS — Z23 Encounter for immunization: Secondary | ICD-10-CM

## 2020-05-25 DIAGNOSIS — Z0000 Encounter for general adult medical examination without abnormal findings: Secondary | ICD-10-CM

## 2020-05-25 DIAGNOSIS — M25511 Pain in right shoulder: Secondary | ICD-10-CM | POA: Insufficient documentation

## 2020-05-25 MED ORDER — NAPROXEN 500 MG PO TABS: 500.0000 mg | ORAL_TABLET | Freq: Two times a day (BID) | ORAL | 0 refills | Status: AC

## 2020-05-25 MED FILL — NAPROXEN 500 MG TABLET: 500 | 14 days supply | Qty: 28 | Fill #0

## 2020-05-25 NOTE — Progress Notes (Signed)
   CC: Right shoulder and elbow pain  HPI:  [name] is a 49 y.o. otherwise healthy female presenting with 8 months of right elbow pain and 1 month of right shoulder pain.  She denies any history of trauma as an inciting factor; however, she notes she and her daughter began an exercise bootcamp program just before her shoulder pain began.  She endorses pain radiating down from her lateral shoulder into her elbow and into her middle three fingers.  She endorses tingling and numbness in her fingers.  Her pain is constant and worse with movement.  She has tried an unknown ointment on her shoulder but no medications or ice or heat.  She notes she spends a lot of time doing household chores and cleaning.  She endorses upper back/trapezius pain but denies any weakness or swelling of her RUE.  She denies any wrist pain or neck pain or any other joint pain.  Past Medical History:  Diagnosis Date  . Chronic upper back pain   . Right wrist pain 03/05/2018   PSHx: Patient lives at home with her daughter and does not currently have a job.  Patient's primary language is Spanish although she prefers her daughter to translate for her today.   Review of Systems:  All others negative except as noted in HPI.    Vitals:   05/25/20 1410  BP: (!) 134/73  Pulse: 77  Temp: 98.3 F (36.8 C)  TempSrc: Oral  SpO2: 100%  Weight: 147 lb (66.7 kg)  Height: 5\' 2"  (1.575 m)   Physical Exam: Constitutional: Patient appears well. No acute distress. Eyes: No conjunctival injection. Sclera non-icteric.  Respiratory: Lungs are clear to auscultation, bilaterally. No wheezes, rales, or rhonchi. Cardiovascular: Regular rate and rhythm. No murmurs, rubs, or gallops. No LE edema. Radial pulses 2+ bilaterally.  Musculoskeletal: Positive empty can test on the right. There is minimal tenderness over the medial epicondyle of the right humerus without step-offs or swelling. No tenderness over the right shoulder. Full  ROM of bilateral upper extremities and neck. 5/5 strength throughout right upper extremity. No scoliosis with normal spinal curvature.  Neurological: Negative Spurling test. Sensation grossly intact in bilateral upper extremities. No inducible ulnar radiculopathy. Skin: No lesions notes. No jaundice.  Assessment & Plan:   See Encounters Tab for problem based charting.  Rotator cuff chronic tear tendinopathy   Patient seen with Dr. .  Mayford Knife, PGY1 [facility] Internal Medicine  Pager: [phone]

## 2020-05-25 NOTE — Assessment & Plan Note (Signed)
A: Patient has never been screened for hepatitis C and is overdue for Tdap vaccine.   P: Given TDap vaccine and screened for Hep C Ab.

## 2020-05-25 NOTE — Patient Instructions (Addendum)
Hoy, discutimos que su dolor de hombro probablemente se deba a Patent examiner crnico del manguito rotador o inflamacin de sus tendones en su hombro que causa dolor en su brazo derecho.  Le recet naproxeno. Tome 1 tableta por va oral dos veces al da y contine tomando este medicamento dos veces al da hasta que mejore el dolor. Puede que tarde algn tiempo.  Tambin puede aplicar hielo en su brazo o Research scientist (medical) en su brazo para Engineer, materials. Por favor, abstngase de hacer ejercicio pesado con el brazo derecho durante las [employer].  No dude en llamar si tiene preguntas o inquietudes: [phone].  Regrese a la clnica para verme dentro 6 meses para una visita regular.  Gracias,  Dra. Glenford Bayley  --------------  Today, we discussed that your shoulder pain is most likely due to chronic rotator cuff tear or swelling of your tendons in your shoulder causing pain down your right arm.   I prescribed Naproxen. Please take 1 tablet by mouth twice per day and continue to take this medication twice every day until your pain improves. It may take some time.   You may also ice your arm or use heat on your arm to improve the pain. Please refrain from heavy exercise using your right arm over the next couple weeks.  Please don't hesitate to call with questions or concerns: [phone].  Please return to see me within 6 months for a regular visit.  Thank you,  Dr. Matilde Sprang por desgarro del manguito rotador, cuidados posteriores Surgery for Rotator Cuff Tear, Care After [employer] brinda informacin sobre cmo cuidarse despus del procedimiento. El mdico tambin podr darle indicaciones ms especficas. Comunquese con el mdico si tiene problemas o preguntas. Qu puedo esperar despus del procedimiento? Despus del procedimiento, es comn [employer] siguientes sntomas:  Hinchazn.  Dolor.  Entumecimiento.  Dolor a Insurance claims handler. Siga estas  indicaciones en su casa: Si tiene un cabestrillo o un inmovilizador de hombro:  selo como se lo haya indicado el mdico. Quteselo solamente como se lo haya indicado el mdico.  Afljelo si los dedos de las manos se le adormecen, siente hormigueos o se le enfran y se tornan de Research officer, trade union.  Mantngalo limpio. Baos  No tome baos de inmersin, no nade ni use el jacuzzi hasta que el mdico lo autorice. Pregntele al mdico si puede ducharse. Delle Reining solo le permitan darse baos de [place].  Mantenga la venda (vendaje) seca hasta que el mdico le diga que se la puede quitar.  Si el cabestrillo o el inmovilizador del hombro no es impermeable: ? No deje que se moje. ? Dorena Cookey cuando tome un bao de inmersin o una ducha como se lo haya indicado el mdico. Una vez retirado el cabestrillo o el inmovilizador del hombro, trate de no mover el hombro hasta que el mdico lo autorice. Cuidado de la incisin   Siga las indicaciones del mdico acerca del cuidado de la incisin. Asegrese de hacer lo siguiente: ? Lvese las manos con agua y [place] antes y despus de cambiar el vendaje. Use desinfectante para manos si no dispone de [place] y [place]. ? Cambie el vendaje como se lo haya indicado el mdico. ? No retire los puntos (suturas), la goma para cerrar la piel o las tiras [place]. Es posible que estos cierres cutneos Conservation officer, nature en la piel durante 2semanas o ms tiempo. Si los bordes de las tiras Jenny Reichmann empiezan a despegarse y [place], [place]  recortar los que estn sueltos. No retire las tiras [employer] por completo a menos que el mdico se lo indique.  Controle todos los das la zona de la incisin para detectar signos de infeccin. Est atento a los siguientes signos: ? Aumento del enrojecimiento, la hinchazn o Chief Technology Officer. ? Ms lquido [place]. ? Calor. ? Pus o mal olor. Control del dolor, la rigidez y la hinchazn   Si se lo indican, aplique hielo en la zona del hombro. ? Ponga  el hielo en una bolsa plstica. ? Coloque una [employer] piel y Copy. ? Coloque el hielo durante , 2 a 3veces por da.  Mueva los dedos con frecuencia para reducir la rigidez y la hinchazn.  Levante (eleve) la parte superior del cuerpo con almohadas mientras est acostado o durmiendo. ? No duerma boca abajo (sobre el abdomen). ? No duerma sobre el costado en el que le realizaron la [place]. Medicamentos  [employer] de venta libre y los recetados solamente como se lo haya indicado el mdico.  Pregntele al mdico si el medicamento recetado: ? Hace que sea necesario que evite conducir o usar maquinaria pesada. ? Puede causarle estreimiento. Es posible que deba tomar medidas para prevenir o tratar el estreimiento, por ejemplo:  Beber suficiente lquido como para Pharmacologist la orina de color amarillo plido.  Tomar medicamentos recetados o de [employer].  Consumir alimentos ricos en fibra, como frijoles, cereales integrales, y frutas y verduras frescas.  Limitar su consumo de alimentos ricos en grasa y azcares procesados, como los alimentos fritos o dulces. Conducir  No conduzca durante 24horas si le administraron un sedante durante el procedimiento.  No conduzca mientras est usando un cabestrillo o un inmovilizador de hombro. Pregntele al mdico cundo es seguro volver a Science writer. Actividad  No use el brazo para apoyar el peso del cuerpo hasta que el mdico lo autorice.  No levante ni sostenga nada con el brazo hasta que el mdico lo autorice.  Retome sus actividades normales segn lo indicado por el mdico. Pregntele al mdico qu actividades son seguras para usted.  Haga ejercicios como se lo haya indicado el mdico. Indicaciones generales  No consuma ningn producto que contenga nicotina o tabaco, como cigarrillos, cigarrillos electrnicos y tabaco de Theatre manager. Estos pueden retrasar la cicatrizacin despus de la ciruga. Si necesita ayuda  para dejar de consumir, consulte al mdico.  Concurra a todas las visitas de control como se lo haya indicado el mdico. Esto es importante. Comunquese con un mdico si:  Tiene fiebre.  Aumentan el enrojecimiento, la hinchazn o el dolor alrededor de su incisin.  Sale ms lquido o sangre de su incisin.  Su incisin se siente caliente al tacto.  Tiene pus o percibe que sale mal olor de su incisin.  Tiene un dolor que empeora o que no mejora con los medicamentos. Solicite ayuda inmediatamente si:  Siente dolor intenso.  Pierde la sensibilidad en el brazo o la [place].  La mano o los dedos se le ponen muy plidos o de color [place]. Resumen  Si tiene un cabestrillo, selo como se lo haya indicado el mdico. Quteselo solamente como se lo haya indicado el mdico.  Cambie el vendaje como se lo haya indicado el mdico. Controle todos los das la zona de la incisin para Engineer, manufacturing signos de infeccin.  Si se lo indican, aplique hielo en la zona del hombro 2 o 3 veces por da.  No use el brazo para Print production planner  ni para apoyar el peso del cuerpo hasta que el mdico lo autorice. Esta informacin no tiene Theme park manager el consejo del mdico. Asegrese de hacerle al mdico cualquier pregunta que tenga. Document Revised: 09/07/2018 Document Reviewed: 09/07/2018 Elsevier Patient Education  2020 [employer].

## 2020-05-25 NOTE — Assessment & Plan Note (Signed)
A: Patient endorses 1 month of right shoulder pain s/p intensive exercising and possible overuse, associated with pain over the medial epicondylar region of the humerus and radiculopathy into middle 3 fingers. Positive empty can test on the right with intact strength, sensation, and ROM. Most consistent with supraspinatus impingement vs. Other rotator cuff chronic tear.   P: Prescribed 2 weeks of Naproxen 500mg  BID to be taken with meals.  - Encouraged time off of rigorous exercise / repetitive motions involving R shoulder.  - Encouraged the use of ice

## 2020-05-26 ENCOUNTER — Telehealth: Payer: Self-pay | Admitting: Student

## 2020-05-26 LAB — HEPATITIS C ANTIBODY: Hep C Virus Ab: <0.1 s/co ratio (ref 0.0–0.9)

## 2020-05-26 NOTE — Telephone Encounter (Signed)
Left message on patient's personal mobile device that test results were normal and informed patient to call with any questions or concerns at [phone].  Glenford Bayley, PGY1 Internal medicine Pager: [phone]

## 2020-05-29 NOTE — Progress Notes (Signed)
Internal Medicine Clinic Attending  I saw and evaluated the patient.  I personally confirmed the key portions of the history and exam documented by Dr. Speakman and I reviewed pertinent patient test results.  The assessment, diagnosis, and plan were formulated together and I agree with the documentation in the resident's note.  

## 2020-06-15 ENCOUNTER — Other Ambulatory Visit: Payer: Self-pay | Admitting: Student

## 2020-06-15 DIAGNOSIS — M25511 Pain in right shoulder: Secondary | ICD-10-CM

## 2020-06-16 NOTE — Telephone Encounter (Signed)
Patient called asking for refill of Naproxen 500 mg for her acute shoulder pain. Patient was seen 3 weeks ago by Dr. Laddie Aquas with instructions to rest, use ice, and take naproxen BID with meals for pain. If patient is still having shoulder pain that is not improving after 3 weeks of conservative treatment with NSAIDS, I do not see an additional prescription as beneficial. Would like patient to be reevaluated in the clinic for re-examination, and possible steroid injection and/or physical therapy referral.

## 2020-06-22 ENCOUNTER — Encounter: Payer: Self-pay | Admitting: Internal Medicine

## 2021-03-29 ENCOUNTER — Encounter: Payer: Self-pay | Admitting: *Deleted

## 2021-12-06 ENCOUNTER — Encounter: Payer: Self-pay | Admitting: Internal Medicine

## 2021-12-10 ENCOUNTER — Encounter: Payer: Self-pay | Admitting: Internal Medicine

## 2022-08-01 ENCOUNTER — Ambulatory Visit: Payer: Self-pay | Admitting: Student

## 2022-09-11 ENCOUNTER — Other Ambulatory Visit: Payer: Self-pay

## 2022-09-11 DIAGNOSIS — Z1231 Encounter for screening mammogram for malignant neoplasm of breast: Secondary | ICD-10-CM

## 2022-09-16 ENCOUNTER — Telehealth: Payer: Self-pay | Admitting: *Deleted

## 2022-10-22 ENCOUNTER — Ambulatory Visit: Payer: Self-pay

## 2022-11-05 ENCOUNTER — Ambulatory Visit: Payer: Self-pay

## 2022-11-14 ENCOUNTER — Ambulatory Visit: Payer: Self-pay

## 2022-11-14 ENCOUNTER — Inpatient Hospital Stay: Admission: RE | Admit: 2022-11-14 | Payer: Self-pay | Source: Ambulatory Visit

## 2022-11-20 NOTE — Progress Notes (Unsigned)
Patient has been rescheduled 4 times per manager pt can not be rescheduled for [facility]

## 2023-01-15 ENCOUNTER — Encounter: Payer: Self-pay | Admitting: Student

## 2023-04-09 ENCOUNTER — Encounter: Payer: Self-pay | Admitting: *Deleted
